# Patient Record
Sex: Male | Born: 1935 | Race: Black or African American | Hispanic: No | Marital: Married | Smoking: Never smoker
Health system: Southern US, Community
[De-identification: ages and names within clinical notes are randomized; demographics above are authoritative.]

## PROBLEM LIST (undated history)

## (undated) DIAGNOSIS — I10 Essential (primary) hypertension: Secondary | ICD-10-CM

## (undated) DIAGNOSIS — E7800 Pure hypercholesterolemia, unspecified: Secondary | ICD-10-CM

---

## 2005-03-14 ENCOUNTER — Inpatient Hospital Stay (HOSPITAL_COMMUNITY): Admission: AD | Admit: 2005-03-14 | Discharge: 2005-03-21 | Payer: Self-pay | Admitting: Interventional Cardiology

## 2005-03-17 ENCOUNTER — Ambulatory Visit: Payer: Self-pay | Admitting: Internal Medicine

## 2005-03-17 ENCOUNTER — Encounter (INDEPENDENT_AMBULATORY_CARE_PROVIDER_SITE_OTHER): Payer: Self-pay | Admitting: Interventional Cardiology

## 2005-04-15 ENCOUNTER — Ambulatory Visit: Payer: Self-pay | Admitting: Internal Medicine

## 2005-04-21 ENCOUNTER — Ambulatory Visit (HOSPITAL_COMMUNITY): Admission: RE | Admit: 2005-04-21 | Discharge: 2005-04-22 | Payer: Self-pay | Admitting: Internal Medicine

## 2005-04-21 ENCOUNTER — Ambulatory Visit: Payer: Self-pay | Admitting: Internal Medicine

## 2005-06-05 ENCOUNTER — Ambulatory Visit: Payer: Self-pay

## 2008-03-08 ENCOUNTER — Inpatient Hospital Stay (HOSPITAL_COMMUNITY): Admission: RE | Admit: 2008-03-08 | Discharge: 2008-03-14 | Payer: Self-pay | Admitting: Orthopedic Surgery

## 2008-03-10 ENCOUNTER — Ambulatory Visit: Payer: Self-pay | Admitting: Vascular Surgery

## 2008-03-10 ENCOUNTER — Encounter (INDEPENDENT_AMBULATORY_CARE_PROVIDER_SITE_OTHER): Payer: Self-pay | Admitting: Orthopedic Surgery

## 2008-04-10 ENCOUNTER — Encounter: Admission: RE | Admit: 2008-04-10 | Discharge: 2008-05-31 | Payer: Self-pay | Admitting: Orthopedic Surgery

## 2008-06-08 ENCOUNTER — Encounter: Admission: RE | Admit: 2008-06-08 | Discharge: 2008-06-08 | Payer: Self-pay | Admitting: Orthopedic Surgery

## 2009-08-15 IMAGING — CR DG CHEST 2V
2 series · 2 of 2 positions shown · non-contrast
Comparison: 03/14/2005

CLINICAL DATA: Preoperative respiratory exam for hip surgery

CHEST - 2 VIEW

[view not recorded (1 of 2)]
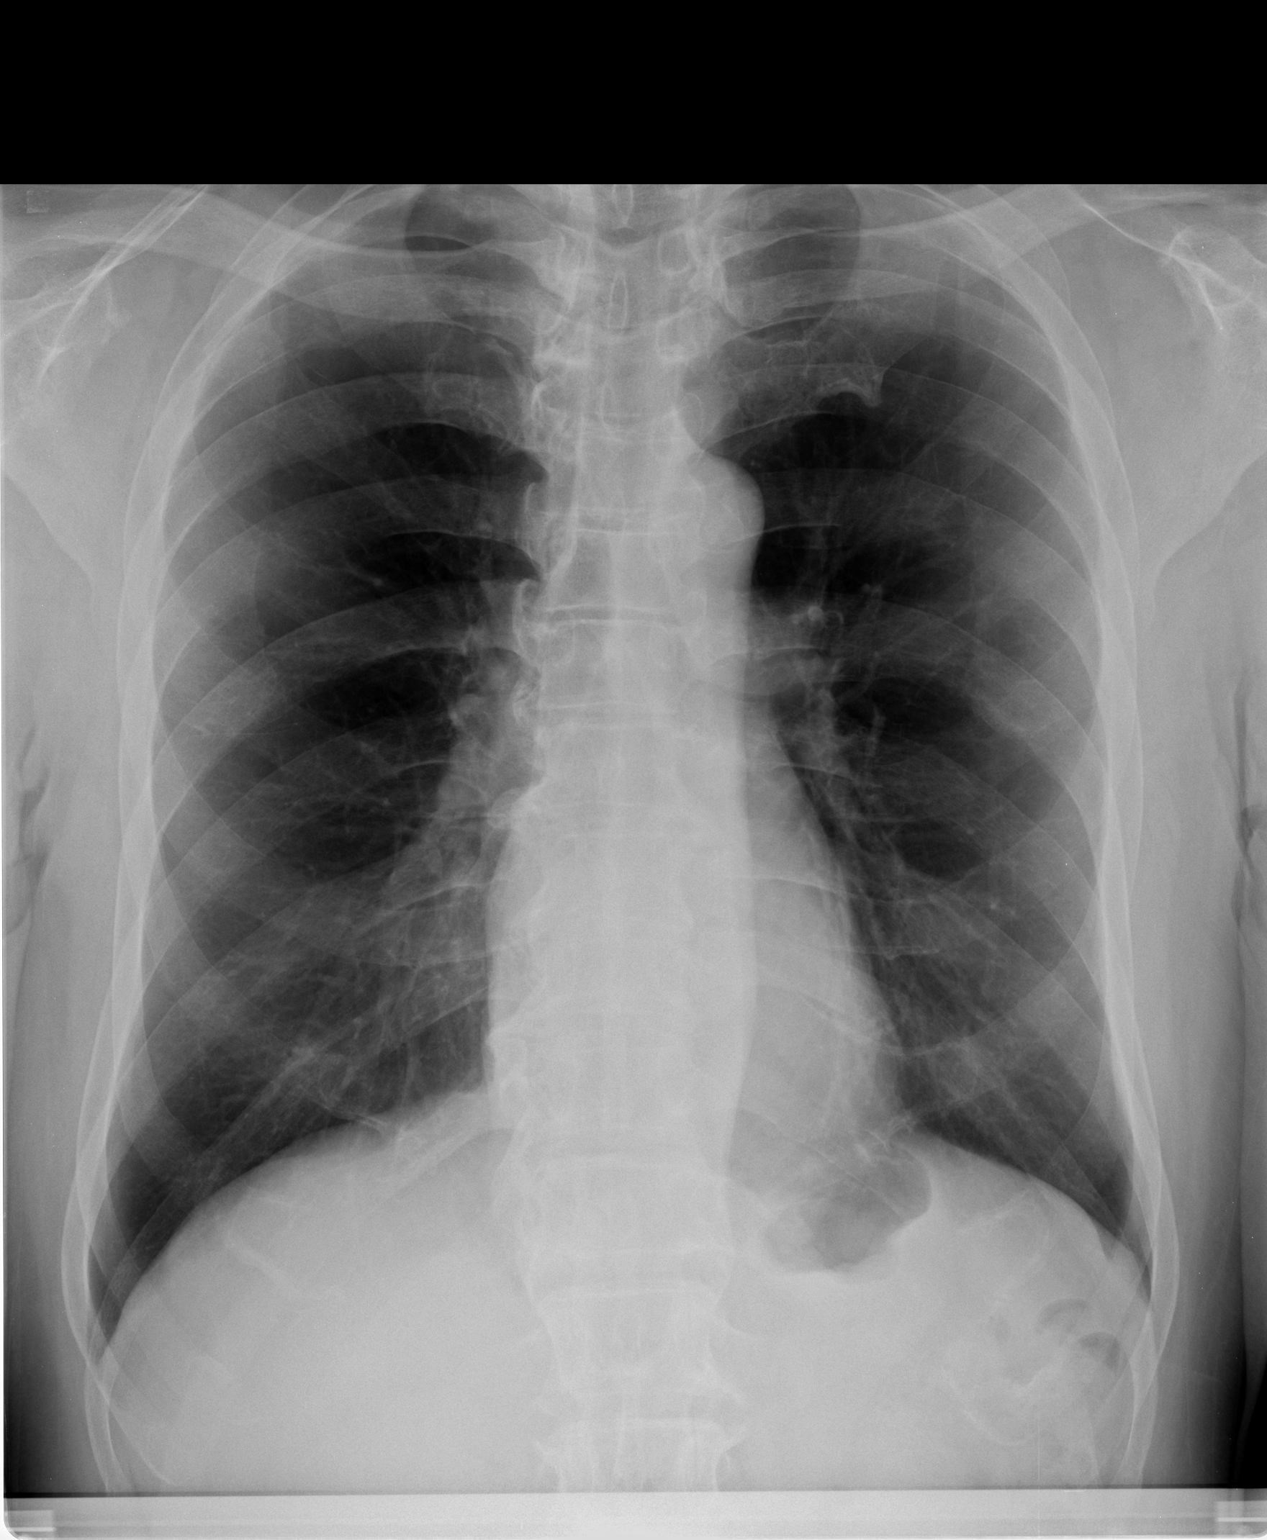

[view not recorded (2 of 2)]
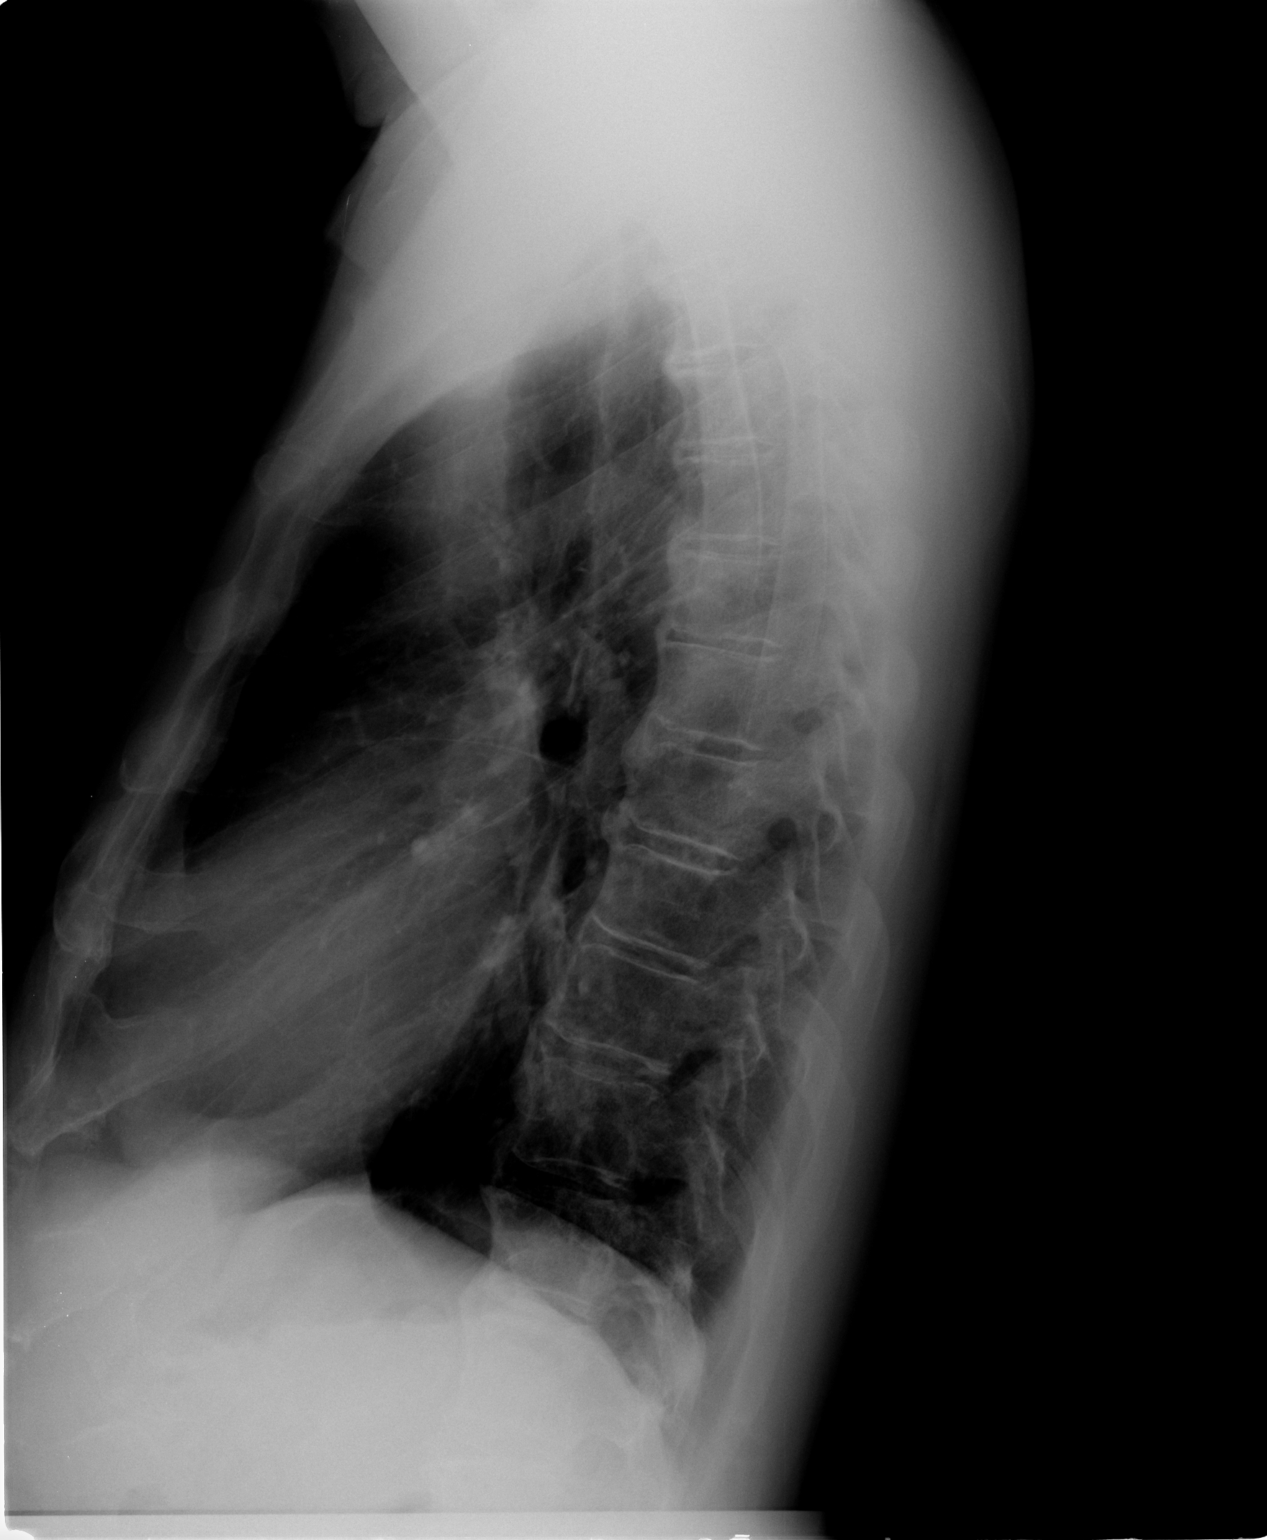

[2 of 2 positions shown; findings below may reference images not displayed]

FINDINGS: Heart size is normal.  Mediastinal contours are normal.
The lungs are clear.  No effusions.  Ordinary degenerative changes
effect the spine.
IMPRESSION: No active disease

## 2009-08-15 IMAGING — CR DG PORTABLE PELVIS
1 series · 1 of 1 positions shown · non-contrast
Comparison: None

CLINICAL DATA: Left total hip arthroplasty.

PORTABLE PELVIS

[view not recorded]
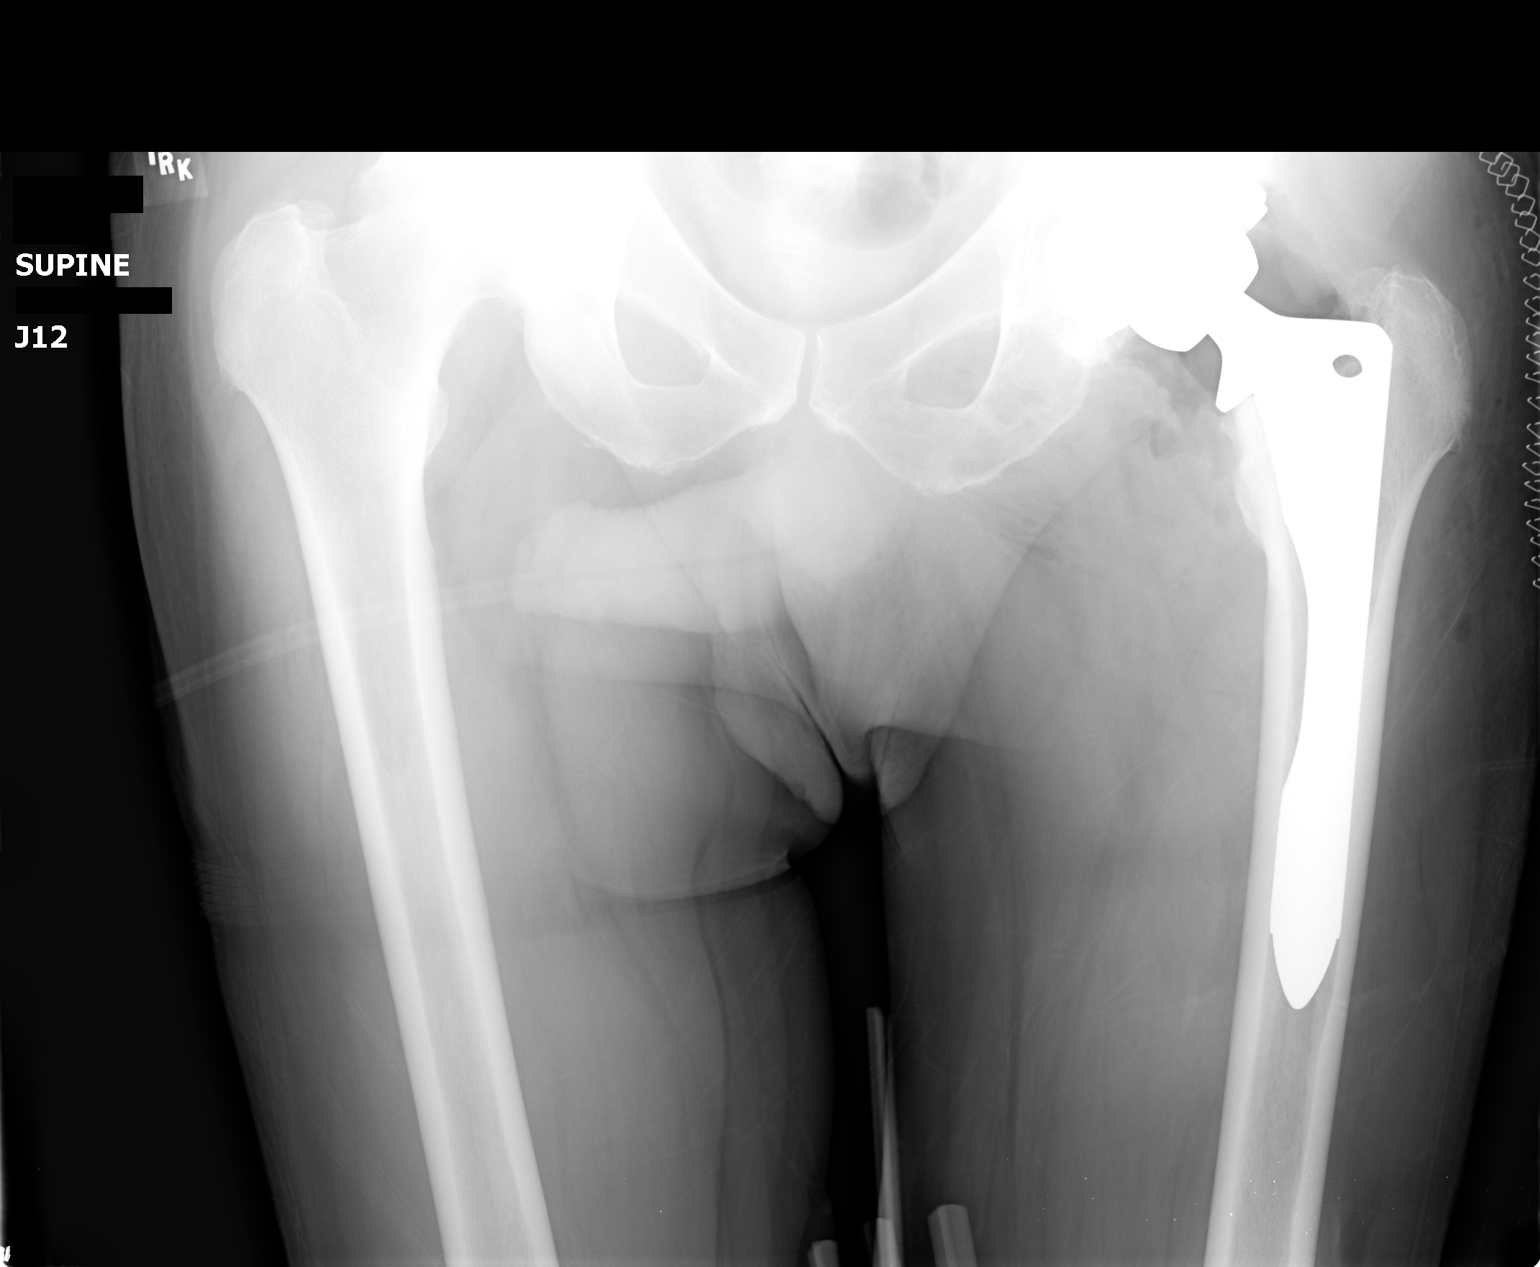

[1 of 1 positions shown; findings below may reference images not displayed]

FINDINGS: The patient is status post left total hip arthroplasty.
Joint and subcutaneous air noted.  Surgical skin staples are in
place.  Degenerative changes are seen in the right hip.
IMPRESSION: 1.  Left total hip arthroplasty without immediate complicating
feature.
2.  Right hip osteoarthritis.

## 2009-08-15 IMAGING — CR DG HIP 1V PORT*L*
1 series · 1 of 1 positions shown · non-contrast
Comparison: None

CLINICAL DATA: Left total hip arthroplasty.

PORTABLE LEFT HIP - 1 VIEW

[view not recorded]
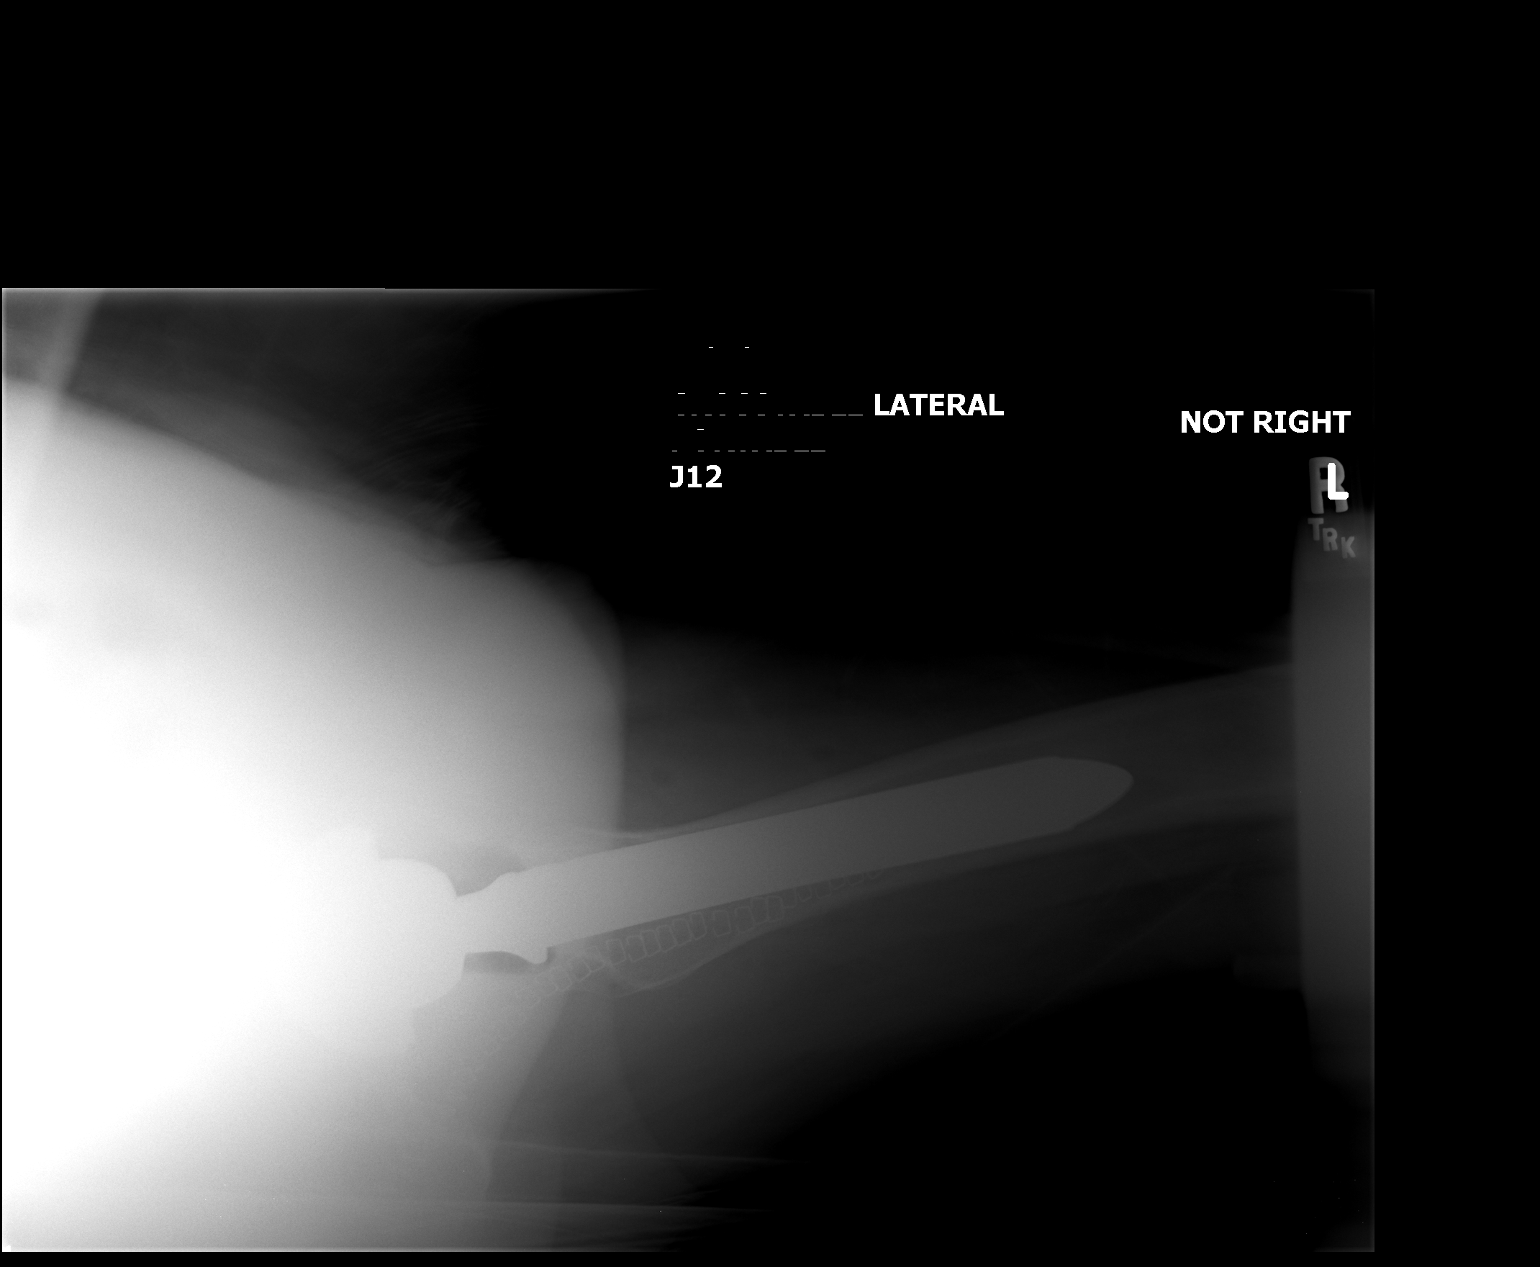

[1 of 1 positions shown; findings below may reference images not displayed]

FINDINGS: The patient is status post left total hip arthroplasty.
Joint and subcutaneous air are noted.  Surgical skin staples are in
place.
IMPRESSION: Left total hip arthroplasty without immediate complicating feature.

## 2009-08-20 IMAGING — CR DG CHEST 2V
2 series · 2 of 2 positions shown · non-contrast
Comparison: CT chest 03/09/2008 and chest x-ray 03/08/2008

CLINICAL DATA: Postop hip replacement with fever.

CHEST - 2 VIEW

[w chest pa]
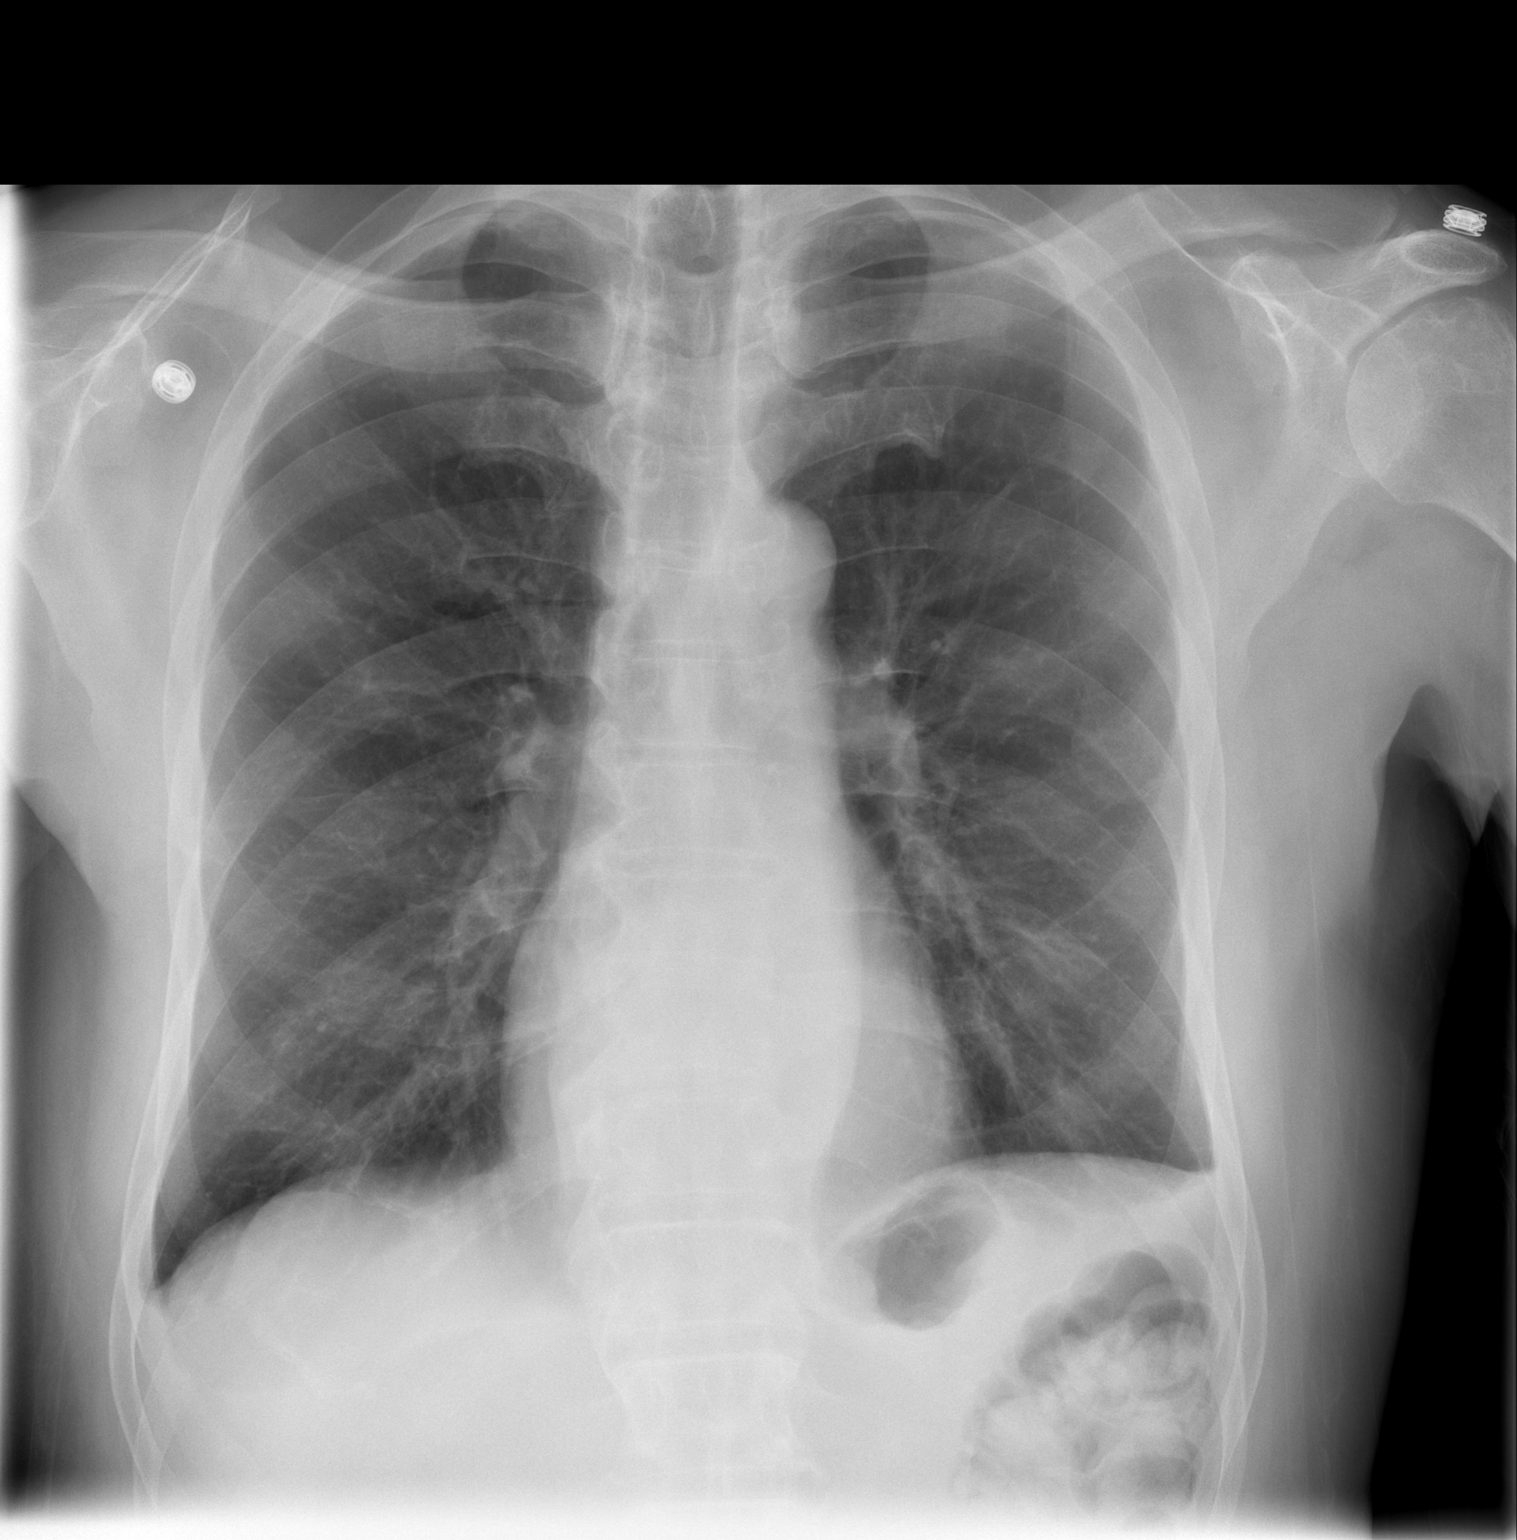

[w chest lat]
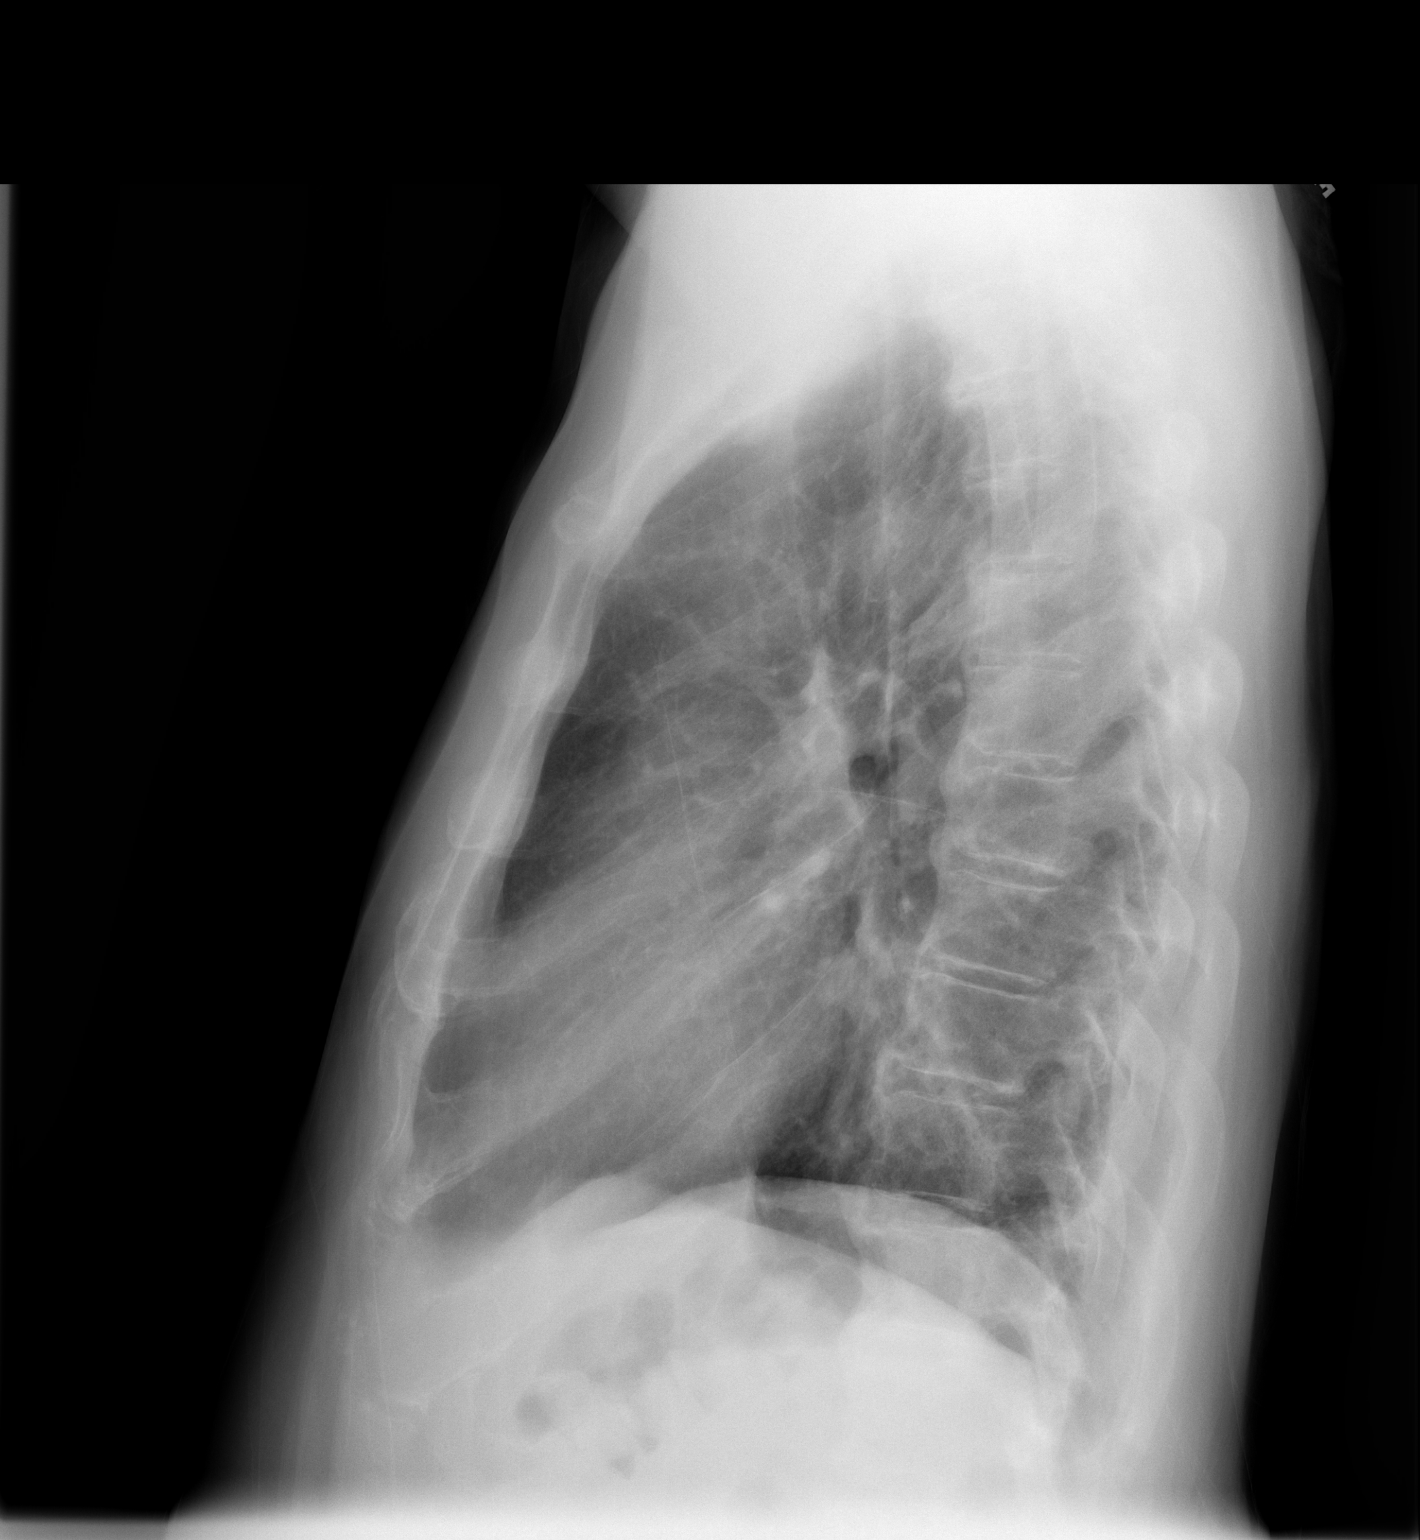

[2 of 2 positions shown; findings below may reference images not displayed]

FINDINGS: Trachea is midline.  Heart size normal.  Biapical pleural
thickening.  Lungs otherwise clear.  Trace bilateral pleural
effusions.
IMPRESSION: Trace bilateral pleural effusions.

## 2010-03-07 ENCOUNTER — Ambulatory Visit: Payer: Self-pay | Admitting: Radiology

## 2010-03-07 ENCOUNTER — Emergency Department (HOSPITAL_BASED_OUTPATIENT_CLINIC_OR_DEPARTMENT_OTHER): Admission: EM | Admit: 2010-03-07 | Discharge: 2010-03-07 | Payer: Self-pay | Admitting: Emergency Medicine

## 2010-10-15 NOTE — Consult Note (Signed)
NAME:  [name], [name] NO.:  [id number]   MEDICAL RECORD NO.:  [id number]          PATIENT TYPE:  INP   LOCATION:                               FACILITY:  [facility]   PHYSICIAN:  Armanda Magic, M.D.     DATE OF BIRTH:  Sep 08, 1932   DATE OF CONSULTATION:  03/09/2008  DATE OF DISCHARGE:                                 CONSULTATION   REFERRING PHYSICIAN:  Dyke Brackett, MD   PRIMARY PHYSICIAN:  Tally Joe, MD   CARDIOLOGIST:  Corky Crafts, MD.   CHIEF COMPLAINT:  Hypotension.   HISTORY OF PRESENT ILLNESS:  This is a 77 year old African American male  with a history of atrial flutter status post ablation who underwent left  total hip replacement yesterday.  Today, he was complaining of pain in  his left lower extremity behind the knee distally.  He then developed  acute hypotension with systolic blood pressures in the 70s, just after  completing rehab therapy.  After 1 liter of normal saline, his systolic  blood pressure was 120.  The patient is now feeling better but still  says he has some pain behind his popliteal fossa and the posterior calf  area.  A 2-D echocardiogram in 2006 showed an EF of 45-50% with mild MR  and moderate TR, was felt to be due to tachycardia-induced  cardiomyopathy.  He underwent Cardiolite which showed no ischemia in the  office, but his heart rate went to 200 and he was lightheaded and given  adenosine for atrial flutter, which then led to his atrial flutter  ablation in 2006.  Repeat Cardiolite preop in our office showed no  inducible ischemia and an EF that has normalized to 81%.  He was  subsequently clear for surgery.   Past medical history includes:  1. Atrial flutter.  2. Hypertension.  3. Tachycardia-induced cardiomyopathy, now with normal LV function.   PAST SURGICAL HISTORY:  Status post atrial flutter ablation.   Allergies are none.   Medications include cefazolin, Colace, Lovenox 40 mg subcu daily,  Dilaudid  p.r.n., and Robaxin p.r.n.   Medications prior to admission include:  1. Vasotec 20 mg b.i.d.  2. Atenolol 50 mg a day.  3. Lipitor 80 mg a day.  4. Hydrochlorothiazide 25 mg a day.  5. Norco p.r.n. for pain.   SOCIAL HISTORY:  He lives in [place].  He is married.  He denies any  tobacco use.  He rarely drinks alcohol.  He denies any drug use.  He is  retired from [employer], [employer].   FAMILY HISTORY:  There is no history of coronary disease.   REVIEW OF SYSTEMS:  Other than what is stated in HPI includes occasional  dizziness and lightheadedness today, as well as fatigue.   PHYSICAL EXAMINATION:  VITAL SIGNS:  Blood pressure is 99/53, O2  saturations 97% on 2 liters, respirations 20, pulse 74, and temperature  max 100.  GENERAL:  He is a well-developed, well-nourished male, lying in bed, in  no acute distress.  HEENT:  Benign.  NECK:  Supple without lymphadenopathy.  Carotid  upstrokes +2  bilaterally.  No bruits.  LUNGS:  Clear to auscultation throughout.  HEART:  Regular rate and rhythm.  No murmurs, rubs, or gallops.  Normal  S1 and S2.  ABDOMEN:  Benign.  EXTREMITIES:  There is no edema.  He does have sequential compression  devices on bilaterally.   Chest x-ray shows no active disease.   LABORATORY:  Sodium 133, potassium 4.5, chloride 99, bicarb 27, BUN 20,  creatinine 1.49, and glucose 124.  White cell count 10.4, platelet count  227, hemoglobin 9.7, and hematocrit 29.2.  D-dimer 1.65.  An EKG shows  sinus rhythm with nonspecific T-wave abnormality.   ASSESSMENT:  1. Acute symptomatic hypotension transiently.  Now, systolic blood      pressure is above 100 after fluid resuscitation.  He is currently      receiving blood for anemia.  BNP is elevated, and the patient has      been complaining of left lower extremity pain behind his popliteal      fossa extending down his posterior aspect of his left lower      extremity.  Suspect possible deep venous thrombosis and  pulmonary      embolism.  We will order a chest CT to rule out pulmonary embolism.      We will cycle cardiac enzymes.  Once blood pressure is stable off      IV fluids, at some point we need to restart beta blocker and low-      dose ACE inhibitor.  2. Status post left total hip replacement.  3. Atrial flutter status post ablation with no arrhythmias.   PLAN:  1. Chest CT to rule out PE.  2. Restart beta blocker and low-dose ACE inhibitor once the blood      pressure is stable off IV fluids.  We will follow with you.      Armanda Magic, M.D.  Electronically Signed     TT/MEDQ  D:  03/09/2008  T:  03/10/2008  Job:  [id number]

## 2010-10-15 NOTE — Op Note (Signed)
NAME:  [name], [name] NO.:  [id number]   MEDICAL RECORD NO.:  [id number]          PATIENT TYPE:  INP   LOCATION:  4739                         FACILITY:  [facility]   PHYSICIAN:  Dyke Brackett, M.D.    DATE OF BIRTH:  [birth year]   DATE OF PROCEDURE:  03/08/2008  DATE OF DISCHARGE:                               OPERATIVE REPORT   INDICATIONS:  This is a 77 year old with severe osteoarthritis of the  left hip thought to be amenable with left total hip replacement.   PREOPERATIVE DIAGNOSIS:  Osteoarthritis of the left hip.   POSTOPERATIVE DIAGNOSIS:  Osteoarthritis of the left hip.   OPERATION:  Left total hip replacement (DePuy Prodigy porous-coated stem  18-mm small stature, 1.5-mm neck length, 36-mm hip ball with 56-mm  pinnacle acetabulum).   SURGEON:  Dyke Brackett, MD   ASSISTANTKatrinka Blazing, PA   BLOOD LOSS:  Approximately 200.   DESCRIPTION OF PROCEDURE:  Sterile prep and drape, lateral position  posterior approach to the hip made splitting of the iliotibial band,  gluteus maximus, identification of the sciatic nerves, splitting the  short external rotators and a T-capsulotomy made in the hip.  Significant osteophytes were encountered in the posterior and anterior  aspect of the hip which were removed followed by cutting the head  conservatively.  A very short neck was present approximately 1  fingerbreadth above the lesser trochanter.  The femur was then reamed to  17.5 mm to accept an 18-mm stem and then progressively broached with a  small stature.  Given the patient's age in the 110s, a good press fit of  the broach was obtained.   Attention was next directed to the acetabulum.  A guide was placed  anterior and posteriorly.  Aggressive reaming up to a 55-mm acetabular  reamer to accept a 56-mm cup with a 36-mm hip ball.  Trial was placed  followed by the final components and then a trial cup liner used with  the broach to size the appropriate neck length  as dictated above.  The  final components were inserted in the acetabular cup followed by femoral  stem with good press fit of the femoral stem, good stability, and again  with final components in place, there was no tendency for dislocation at  all and the leg lengths appeared to be equal.  The wounds were irrigated  and closure was effected with #1 Ethibond, 2-0 Vicryl and skin clips,  Marcaine with epinephrine on the skin.  Lightly compressive sterile  dressing was applied.  Taken to recovery room in stable condition.     Dyke Brackett, M.D.  Electronically Signed    WDC/MEDQ  D:  03/08/2008  T:  03/08/2008  Job:  [id number]

## 2010-10-18 NOTE — H&P (Signed)
[name], [name]              ACCOUNT NO.:  [id number]   MEDICAL RECORD NO.:  [id number]          PATIENT TYPE:  INP   LOCATION:  4740                         FACILITY:  [facility]   PHYSICIAN:  [name], MDDATE OF BIRTH:  1932/12/04   DATE OF ADMISSION:  03/14/2005  DATE OF DISCHARGE:                                HISTORY & PHYSICAL   PRIMARY CARE PHYSICIAN:  [name], M.D.   CHIEF COMPLAINT:  Tachycardia.   HISTORY OF PRESENT ILLNESS:  The patient is a 77 year old man who had not  seen a doctor in many years.  He went to his primary care doctor who noticed  an abnormal EKG, hypertension, and tachycardia.  He was referred for a  stress test.  While on the treadmill, he became extremely tachycardic to the  260s and lightheaded.  A 6 mg adenosine IV bolus brought his heart rate back  down to baseline.  His rhythm was a slow atrial flutter at approximately a  rate of 130.  He remains asymptomatic.   PAST MEDICAL HISTORY:  Hypertension.   PAST SURGICAL HISTORY:  None.   CURRENT MEDICATIONS:  Tenoretic 50/25.   ALLERGIES:  No known drug allergies.   SOCIAL HISTORY:  The patient occasionally drinks alcohol.  No tobacco.  No  IV drug abuse.  No herbal medicines.   FAMILY HISTORY:  No early CAD.   REVIEW OF SYSTEMS:  No fever or chills.  No weight loss.  He does have left  hip pain.  All other systems negative.   PHYSICAL EXAMINATION:  VITAL SIGNS:  Blood pressure 150/80, heart rate of  135, respiratory rate is 16.  GENERAL:  The patient is awake and alert, no apparent distress.  NECK:  No JVD.  No carotid bruits.  CARDIOVASCULAR:  Irregularly irregular rhythm, tachycardic, S1 S2.  LUNGS:  Clear to auscultation bilaterally.  ABDOMEN:  Soft, nontender, nondistended.  EXTREMITIES:  Showed no cyanosis, clubbing, or edema.  Dorsalis pedis pulses  2+ bilaterally.   Labs at the time of dictation are pending.   ASSESSMENT/PLAN:  A 77 year old with atrial  flutter, question whether he has  an accessory pathway.   PLAN:  1.  Increase rate control meds, will not give a calcium-channel blocker at      this time since it does seem that he is able to conduct 1:1 with his      flutter. It seems likely that he may have an accessory pathway which      allows him to conduct 1:1.  We will use beta-blockade at this time.  We      will also add enalapril 5 mg b.i.d. for hypertension. We will evaluate      his LV function with an echo.  If decreased LV function, then we may      need to take cath to rule out coronary artery disease.  2.  Observe at [facility] at least overnight.  3.  Heparin drip for now.  Will eventually need Coumadin for      anticoagulation.  4.  We will rule  out MI with enzymes.           ______________________________  Joshua Crafts, MD     JSV/MEDQ  D:  03/14/2005  T:  03/14/2005  Job:  [id number]

## 2010-10-18 NOTE — Discharge Summary (Signed)
NAME:  [name]              ACCOUNT NO.:  [id number]   MEDICAL RECORD NO.:  [id number]          PATIENT TYPE:  INP   LOCATION:  5002                         FACILITY:  [facility]   PHYSICIAN:  Dyke Brackett, M.D.    DATE OF BIRTH:  [birth year]   DATE OF ADMISSION:  03/08/2008  DATE OF DISCHARGE:  03/14/2008                               DISCHARGE SUMMARY   PROCEDURE:  Left total hip replacement.   ADMITTING DIAGNOSIS:  Left hip osteoarthritis.   DISCHARGE DIAGNOSIS:  Status post left total hip replacement for left  hip osteoarthritis.   HOSPITAL COURSE:  [name] is a 77 year old male following a left  total hip replacement transferred to PACU in stable condition and up to  5000.  Postoperative day #1 was March 09, 2008, the patient was doing  well with minimal pain.  No history of chest pain or trouble breathing.  Vital signs stable, afebrile.  Hemoglobin of 9.7.  Sat up with PT and  OT, weightbearing as tolerated, weaning PCA pump.  Postoperative day #2  was March 10, 2008, the patient still doing well, hemoglobin 9.2.  Decided to get a lower extremity Doppler ultrasound which was negative  for DVT.  Initially, the patient was in a monitored bed and planning to  transfer to 5000, but we need cardiology clearance as the patient had an  episode of hypotension and acute blood loss anemia. Cardiology  following, no evidence of DVT or PE.  Blood pressure was stable, so I  did agree to transfer to 5000 to help monitor, and postoperative day #3  was on March 11, 2008, followed by Cardiology.  The patient was in  stable condition per Cardiology.  They were okay with discharging home.  On postoperative day #3, his hemoglobin is 8.9, vitals signs stable,  afebrile.  The patient still weightbearing as tolerated left lower  extremity.  Going on to postoperative day #4 is March 12, 2008,  hemoglobin at 8.4, vital signs stable, afebrile.  Weightbearing as  tolerated.  Given 40 mEq  of potassium for hypokalemia.  The patient's  hemoglobin was discussed with Dr. Patty Sermons, had 2 units of blood  transfusion due to his history of A-flutter.  Postoperative day #5,  hemoglobin 10.6, slightly elevated temperature at 101.5.  Chest x-ray  showed bilateral pleural effusion.  Urinalysis was negative.  Plan to  consider discharge home next day if temperature decreases.  Going on to  postoperative day #6 on March 13, 2008, Cardiology okay to be  discharged home.  The patient is planned to discharge home, 10.5 was the  hemoglobin, on March 14, 2008, postoperative day #6.   ASSESSMENT AND PLAN:  [name] is status post left total hip  replacement.  Discharged home on March 14, 2008 and on postoperative  #6 doing well.  Weightbearing as tolerated.  Knee immobilizer on the  left lower extremity.  Regular diet.  Going to follow up with Dr.  Candise Bowens office in about 2 weeks for staple removal.   DISCHARGE MEDICATIONS:  1. Lovenox     40 mg subcu per  day for total of 14 days      postoperatively.  2. Robaxin 500 mg q.6-8 h. p.r.n. pain.  3. Percocet 5/325 one to two tablets p.o. q.4-6 h. p.r.n. pain.  4. Hydrochlorothiazide 25 mg daily.  5. Enalapril 20 mg daily.  6. Atenolol 50 mg daily.  7. Lipitor 40 mg daily.  8. Aspirin 81 mg daily.  9. Vitamin B12 daily.      Sharol Given, PA      Dyke Brackett, M.D.  Electronically Signed    JBS/MEDQ  D:  06/28/2008  T:  06/29/2008  Job:  [id number]

## 2011-03-03 LAB — BASIC METABOLIC PANEL
BUN: 20
BUN: 6
BUN: 9
CO2: 27
CO2: 30
CO2: 30
Calcium: 8.4
Calcium: 8.9
Calcium: 9.2
Chloride: 102
Chloride: 103
Chloride: 104
Chloride: 99
Creatinine, Ser: 1.08
Creatinine, Ser: 1.49
GFR calc Af Amer: 56 — ABNORMAL LOW
GFR calc non Af Amer: 46 — ABNORMAL LOW
GFR calc non Af Amer: 55 — ABNORMAL LOW
GFR calc non Af Amer: >60
Glucose, Bld: 101 — ABNORMAL HIGH
Glucose, Bld: 103 — ABNORMAL HIGH
Glucose, Bld: 122 — ABNORMAL HIGH
Glucose, Bld: 124 — ABNORMAL HIGH
Glucose, Bld: 95
Potassium: 3.2 — ABNORMAL LOW
Potassium: 3.8
Potassium: 4.1
Potassium: 4.5
Sodium: 133 — ABNORMAL LOW
Sodium: 133 — ABNORMAL LOW
Sodium: 139
Sodium: 141

## 2011-03-03 LAB — CBC
HCT: 24.9 — ABNORMAL LOW
HCT: 26.3 — ABNORMAL LOW
HCT: 29.2 — ABNORMAL LOW
HCT: 37.1 — ABNORMAL LOW
Hemoglobin: 10.6 — ABNORMAL LOW
Hemoglobin: 12.5 — ABNORMAL LOW
Hemoglobin: 8.9 — ABNORMAL LOW
Hemoglobin: 9.2 — ABNORMAL LOW
Hemoglobin: 9.7 — ABNORMAL LOW
MCHC: 33.3
MCHC: 33.7
MCHC: 34.2
MCHC: 34.2
MCV: 100.3 — ABNORMAL HIGH
MCV: 101.2 — ABNORMAL HIGH
MCV: 93.4
MCV: 94.4
MCV: 95.7
Platelets: 152
Platelets: 181
Platelets: 227
Platelets: 301
RBC: 2.88 — ABNORMAL LOW
RBC: 3.7 — ABNORMAL LOW
RDW: 12.3
RDW: 12.6
RDW: 14.2
RDW: 14.8
RDW: 14.9
RDW: 15
WBC: 10.4
WBC: 5.2
WBC: 9.9

## 2011-03-03 LAB — PROTIME-INR
INR: 1
Prothrombin Time: 13.8

## 2011-03-03 LAB — DIFFERENTIAL
Basophils Absolute: 0
Basophils Relative: 1
Eosinophils Absolute: 0
Eosinophils Relative: 0
Lymphocytes Relative: 21
Lymphs Abs: 1.1
Monocytes Absolute: 0.4
Monocytes Relative: 7
Neutro Abs: 3.7
Neutrophils Relative %: 71

## 2011-03-03 LAB — URINALYSIS, ROUTINE W REFLEX MICROSCOPIC
Bilirubin Urine: NEGATIVE
Bilirubin Urine: NEGATIVE
Glucose, UA: NEGATIVE
Hgb urine dipstick: NEGATIVE
Hgb urine dipstick: NEGATIVE
Ketones, ur: NEGATIVE
Ketones, ur: NEGATIVE
Nitrite: NEGATIVE
Protein, ur: NEGATIVE
Protein, ur: NEGATIVE
Specific Gravity, Urine: 1.008
Urobilinogen, UA: 0.2
Urobilinogen, UA: 2 — ABNORMAL HIGH
pH: 6.5

## 2011-03-03 LAB — URINE CULTURE
Colony Count: NO GROWTH
Culture: NO GROWTH

## 2011-03-03 LAB — COMPREHENSIVE METABOLIC PANEL
ALT: 26
AST: 31
Albumin: 4.1
Alkaline Phosphatase: 72
BUN: 13
CO2: 32
Calcium: 9.9
Chloride: 105
Creatinine, Ser: 1.18
GFR calc Af Amer: >60
GFR calc non Af Amer: >60
Glucose, Bld: 92
Potassium: 3.6
Sodium: 144
Total Bilirubin: 1.2
Total Protein: 7.7

## 2011-03-03 LAB — TYPE AND SCREEN
ABO/RH(D): O POS
Antibody Screen: NEGATIVE

## 2011-03-03 LAB — CARDIAC PANEL(CRET KIN+CKTOT+MB+TROPI)
CK, MB: 1
CK, MB: 1.3
CK, MB: 1.4
Relative Index: 0.3
Relative Index: 0.3
Total CK: 344 — ABNORMAL HIGH
Troponin I: 0.02
Troponin I: <0.01

## 2011-03-03 LAB — PREPARE RBC (CROSSMATCH)

## 2011-03-03 LAB — CROSSMATCH

## 2011-03-03 LAB — HEMOGLOBIN AND HEMATOCRIT, BLOOD
HCT: 23.8 — ABNORMAL LOW
Hemoglobin: 8 — ABNORMAL LOW

## 2011-03-03 LAB — ABO/RH: ABO/RH(D): O POS

## 2011-03-03 LAB — D-DIMER, QUANTITATIVE: D-Dimer, Quant: 1.65 — ABNORMAL HIGH

## 2011-03-03 LAB — APTT: aPTT: 31

## 2011-03-03 LAB — B-NATRIURETIC PEPTIDE (CONVERTED LAB): Pro B Natriuretic peptide (BNP): 91

## 2012-01-12 ENCOUNTER — Emergency Department (HOSPITAL_COMMUNITY)
Admission: EM | Admit: 2012-01-12 | Discharge: 2012-01-12 | Disposition: A | Discharge status: Home / Self Care | Payer: Medicare Other | Attending: Internal Medicine | Admitting: Internal Medicine

## 2012-01-12 ENCOUNTER — Encounter (HOSPITAL_COMMUNITY): Payer: Self-pay | Admitting: Neurology

## 2012-01-12 DIAGNOSIS — E7800 Pure hypercholesterolemia, unspecified: Secondary | ICD-10-CM | POA: Insufficient documentation

## 2012-01-12 DIAGNOSIS — F10929 Alcohol use, unspecified with intoxication, unspecified: Secondary | ICD-10-CM

## 2012-01-12 DIAGNOSIS — F1010 Alcohol abuse, uncomplicated: Secondary | ICD-10-CM | POA: Insufficient documentation

## 2012-01-12 DIAGNOSIS — I10 Essential (primary) hypertension: Secondary | ICD-10-CM | POA: Insufficient documentation

## 2012-01-12 HISTORY — DX: Pure hypercholesterolemia, unspecified: E78.00

## 2012-01-12 HISTORY — DX: Essential (primary) hypertension: I10

## 2012-01-12 LAB — BASIC METABOLIC PANEL
Calcium: 9.5 mg/dL (ref 8.4–10.5)
GFR calc Af Amer: 52 mL/min — ABNORMAL LOW (ref >90)
GFR calc non Af Amer: 45 mL/min — ABNORMAL LOW (ref >90)
Potassium: 5 mEq/L (ref 3.5–5.1)
Sodium: 141 mEq/L (ref 135–145)

## 2012-01-12 LAB — CBC
Hemoglobin: 12.3 g/dL — ABNORMAL LOW (ref 13.0–17.0)
MCH: 32.9 pg (ref 26.0–34.0)
MCHC: 33.3 g/dL (ref 30.0–36.0)
Platelets: 207 10*3/uL (ref 150–400)
RBC: 3.74 MIL/uL — ABNORMAL LOW (ref 4.22–5.81)

## 2012-01-12 LAB — ETHANOL: Alcohol, Ethyl (B): 177 mg/dL — ABNORMAL HIGH (ref 0–11)

## 2012-01-12 NOTE — ED Notes (Signed)
Per ems- Pt was sitting at table at 1115, wife stepped away to get lunch, she returned and found pt unconscious with vomit on shirt. When EMS arrived pt was awake, not following commands, diaphoretic. Speech was noted to be slurred. BP was 88/50. Given 1 L of fluid speech improved. EKG unremarkable. No acute neuro deficits. Following commands at current, still lethargic. Pt reports thinking he may have taken too much atenolol this morning. HR 52, CBG 122. Denying any pain.

## 2012-01-12 NOTE — ED Notes (Signed)
Speech is clear, following commands. Equal grip strengths. Moving all extremities. Pt able to remember right before syncopal episode. Does not remember the episode. Woke up and everyone was around him. Denying any pain. Thoughts are coherent, concise.

## 2012-01-12 NOTE — ED Provider Notes (Signed)
History     CSN: [id number]  Arrival date & time 01/12/12  1328   None     Chief Complaint  Patient presents with  . Altered Mental Status    (Consider location/radiation/quality/duration/timing/severity/associated sxs/prior treatment) HPI  This is a 78 year old man presents with a history of passing out for about 1-1/2 hours this morning. He was found in the house by his wife, seated in the chair, drooling and unresponsive with his breakfast in front of him on the table. He recovered his consciousness en route to the ED by ambulance. At the time when I interview him, he was fully alert and did not have any complaints. He reports that he had just taken his usual blood pressure meds (Atenolol) before he passed out but he had taken two tablets instead of the usual one tablet of his atenolol. BP by EMS was 88/50, HR 52, and CBG was 122 and EKG was normal. He did not take any other medicines - usually takes medications for cholesterol as well. He admits to taking one glass of ram and 1 bottle of beer last evening.  However, he reports a history of dizziness in especially with standing up and this has been going on for the last several months. The patient denies a history of a fever or shortness of breath. No history of chest pain. His wife is very concerned because of his weight loss with at least 20 pounds in the last one year. He also reports history of cold intolerance and inhibition from physical activities for the last several years.     Past Medical History  Diagnosis Date  . Hypertension   . Hypercholesteremia     History reviewed. No pertinent past surgical history.  No family history on file.  History  Substance Use Topics  . Smoking status: Never Smoker   . Smokeless tobacco: Not on file  . Alcohol Use: No      Review of Systems  Constitutional: Positive for activity change, appetite change, fatigue and unexpected weight change. Negative for fever, chills and  diaphoresis.  HENT: Negative.   Eyes: Negative for visual disturbance.  Respiratory: Negative for cough, choking, chest tightness, shortness of breath, wheezing and stridor.   Cardiovascular: Negative for chest pain, palpitations and leg swelling.  Gastrointestinal: Positive for vomiting. Negative for nausea, abdominal pain, diarrhea, constipation, blood in stool, abdominal distention, anal bleeding and rectal pain.  Genitourinary: Negative.   Musculoskeletal: Negative for myalgias, back pain, joint swelling, arthralgias and gait problem.  Neurological: Negative for tremors, seizures, speech difficulty, weakness and numbness.  Psychiatric/Behavioral: Negative.     Allergies  Review of patient's allergies indicates not on file.  Home Medications  No current outpatient prescriptions on file.  BP 116/57  Pulse 73  Temp 97.4 F (36.3 C) (Oral)  Resp 16  SpO2 98%  Physical Exam  Constitutional: He is oriented to person, place, and time. He appears well-developed and well-nourished. No distress.  HENT:  Head: Normocephalic and atraumatic.  Eyes: Conjunctivae and EOM are normal. Pupils are equal, round, and reactive to light.  Neck: Normal range of motion. Neck supple.  Cardiovascular: Normal rate and regular rhythm.   Pulmonary/Chest: Effort normal. No respiratory distress. He has no wheezes. He has rales in the left lower field. He exhibits no tenderness.  Abdominal: Soft. Bowel sounds are normal.  Musculoskeletal: He exhibits no edema and no tenderness.  Neurological: He is alert and oriented to person, place, and time. He has normal strength  and normal reflexes. A cranial nerve deficit is present. No sensory deficit. He displays a negative Romberg sign. Coordination normal. GCS eye subscore is 4. GCS verbal subscore is 5. GCS motor subscore is 6.  Skin: Skin is warm and dry.    ED Course  Procedures (including critical care time)  Labs Reviewed - No data to display No results  found.  EKG - Normal Sinus rhythm Normal Axis  Normal PR, QT and QRS intervals Normal Axis  No LVH  No ST segment of T wave changes..   MDM  It is not clear whether this was a syncopal episode though it is unlikely given the history. He ingested a higher dose of Atenolol which was twice his usual dose. The blood pressure was also reduced as indicated by the EMS records with a BP of 88/50 and a pulse rate of 52. This might explain his presentation especially given his full recovery of mental status after 1L of IV fluids. The other possibility I cannot exclude is alcohol ingestion in this patient who admits to taking a beer and rum last evening. Cardiac or neurological causes are unlikely. This patient will need a thorough evaluation for his symptoms of weight loss and cold intolerance by his PCP. For now we will do the alcohol blood level, BMET, CBC. We will observe him for now.        Dow Adolph, MD 01/12/12 [id number]  Dow Adolph, MD 01/12/12 1529  Dow Adolph, MD 01/12/12 [id number]  Dow Adolph, MD 01/12/12 1538  Dow Adolph, MD 01/12/12 [phone]

## 2012-01-12 NOTE — ED Provider Notes (Signed)
I have personally seen and examined the patient.  I have discussed the plan of care with the resident.  I have reviewed the documentation on PMH/FH/Soc. History.  I have reviewed the documentation of the resident and agree.  Doug Sou, MD 01/12/12 [phone]

## 2012-01-12 NOTE — ED Notes (Signed)
Pt walked to bathroom without assistance. Pt stable

## 2012-01-12 NOTE — ED Provider Notes (Addendum)
Patient reportedly unarousable this morning for a period of approximately half an hour patient thinks he may take an extra dose of atenolol this morning. He is presently asymptomatic on exam Glasgow Coma Score 15 alert awake heart regular rate and rhythm lungs clear auscultation abdomen nondistended nontender patient not lightheaded on standing Lab results noted. difficulty in arousing patient consistent with acute alcohol intoxication. Results for orders placed during the hospital encounter of 01/12/12  BASIC METABOLIC PANEL      Component Value Range   Sodium 141  135 - 145 mEq/L   Potassium 5.0  3.5 - 5.1 mEq/L   Chloride 103  96 - 112 mEq/L   CO2 27  19 - 32 mEq/L   Glucose, Bld 115 (*) 70 - 99 mg/dL   BUN 17  6 - 23 mg/dL   Creatinine, Ser 1.61 (*) 0.50 - 1.35 mg/dL   Calcium 9.5  8.4 - 09.6 mg/dL   GFR calc non Af Amer 45 (*) >90 mL/min   GFR calc Af Amer 52 (*) >90 mL/min  CBC      Component Value Range   WBC 8.3  4.0 - 10.5 K/uL   RBC 3.74 (*) 4.22 - 5.81 MIL/uL   Hemoglobin 12.3 (*) 13.0 - 17.0 g/dL   HCT 04.5 (*) 40.9 - 81.1 %   MCV 98.7  78.0 - 100.0 fL   MCH 32.9  26.0 - 34.0 pg   MCHC 33.3  30.0 - 36.0 g/dL   RDW 91.4  78.2 - 95.6 %   Platelets 207  150 - 400 K/uL  ETHANOL      Component Value Range   Alcohol, Ethyl (B) 177 (*) 0 - 11 mg/dL   No results found.  Doug Sou, MD 01/12/12 1551  Doug Sou, MD 01/12/12 [phone]

## 2012-01-12 NOTE — ED Notes (Signed)
Pt states he understands all discharge instructions

## 2015-12-14 DIAGNOSIS — E782 Mixed hyperlipidemia: Secondary | ICD-10-CM | POA: Diagnosis not present

## 2015-12-14 DIAGNOSIS — I129 Hypertensive chronic kidney disease with stage 1 through stage 4 chronic kidney disease, or unspecified chronic kidney disease: Secondary | ICD-10-CM | POA: Diagnosis not present

## 2015-12-14 DIAGNOSIS — N183 Chronic kidney disease, stage 3 (moderate): Secondary | ICD-10-CM | POA: Diagnosis not present

## 2015-12-14 DIAGNOSIS — R7309 Other abnormal glucose: Secondary | ICD-10-CM | POA: Diagnosis not present

## 2016-02-08 DIAGNOSIS — Z23 Encounter for immunization: Secondary | ICD-10-CM | POA: Diagnosis not present

## 2016-06-20 DIAGNOSIS — I129 Hypertensive chronic kidney disease with stage 1 through stage 4 chronic kidney disease, or unspecified chronic kidney disease: Secondary | ICD-10-CM | POA: Diagnosis not present

## 2016-06-20 DIAGNOSIS — R7301 Impaired fasting glucose: Secondary | ICD-10-CM | POA: Diagnosis not present

## 2016-06-20 DIAGNOSIS — I10 Essential (primary) hypertension: Secondary | ICD-10-CM | POA: Diagnosis not present

## 2016-06-20 DIAGNOSIS — E782 Mixed hyperlipidemia: Secondary | ICD-10-CM | POA: Diagnosis not present

## 2016-06-20 DIAGNOSIS — R7303 Prediabetes: Secondary | ICD-10-CM | POA: Diagnosis not present

## 2016-06-20 DIAGNOSIS — N183 Chronic kidney disease, stage 3 (moderate): Secondary | ICD-10-CM | POA: Diagnosis not present

## 2016-12-18 DIAGNOSIS — I10 Essential (primary) hypertension: Secondary | ICD-10-CM | POA: Diagnosis not present

## 2016-12-18 DIAGNOSIS — E782 Mixed hyperlipidemia: Secondary | ICD-10-CM | POA: Diagnosis not present

## 2016-12-18 DIAGNOSIS — R7303 Prediabetes: Secondary | ICD-10-CM | POA: Diagnosis not present

## 2016-12-18 DIAGNOSIS — N183 Chronic kidney disease, stage 3 (moderate): Secondary | ICD-10-CM | POA: Diagnosis not present

## 2017-05-13 DIAGNOSIS — K4090 Unilateral inguinal hernia, without obstruction or gangrene, not specified as recurrent: Secondary | ICD-10-CM | POA: Diagnosis not present

## 2017-06-01 DIAGNOSIS — I10 Essential (primary) hypertension: Secondary | ICD-10-CM | POA: Diagnosis not present

## 2017-06-01 DIAGNOSIS — T783XXA Angioneurotic edema, initial encounter: Secondary | ICD-10-CM | POA: Diagnosis not present

## 2017-06-25 DIAGNOSIS — N183 Chronic kidney disease, stage 3 (moderate): Secondary | ICD-10-CM | POA: Diagnosis not present

## 2017-06-25 DIAGNOSIS — E782 Mixed hyperlipidemia: Secondary | ICD-10-CM | POA: Diagnosis not present

## 2017-06-25 DIAGNOSIS — R7303 Prediabetes: Secondary | ICD-10-CM | POA: Diagnosis not present

## 2017-06-25 DIAGNOSIS — I10 Essential (primary) hypertension: Secondary | ICD-10-CM | POA: Diagnosis not present

## 2017-12-31 DIAGNOSIS — Z0000 Encounter for general adult medical examination without abnormal findings: Secondary | ICD-10-CM | POA: Diagnosis not present

## 2017-12-31 DIAGNOSIS — N183 Chronic kidney disease, stage 3 (moderate): Secondary | ICD-10-CM | POA: Diagnosis not present

## 2017-12-31 DIAGNOSIS — I10 Essential (primary) hypertension: Secondary | ICD-10-CM | POA: Diagnosis not present

## 2017-12-31 DIAGNOSIS — R7303 Prediabetes: Secondary | ICD-10-CM | POA: Diagnosis not present

## 2018-01-29 DIAGNOSIS — Z23 Encounter for immunization: Secondary | ICD-10-CM | POA: Diagnosis not present

## 2018-07-09 DIAGNOSIS — D649 Anemia, unspecified: Secondary | ICD-10-CM | POA: Diagnosis not present

## 2018-07-09 DIAGNOSIS — I10 Essential (primary) hypertension: Secondary | ICD-10-CM | POA: Diagnosis not present

## 2018-07-09 DIAGNOSIS — N183 Chronic kidney disease, stage 3 (moderate): Secondary | ICD-10-CM | POA: Diagnosis not present

## 2018-07-09 DIAGNOSIS — R7303 Prediabetes: Secondary | ICD-10-CM | POA: Diagnosis not present

## 2019-01-14 DIAGNOSIS — I129 Hypertensive chronic kidney disease with stage 1 through stage 4 chronic kidney disease, or unspecified chronic kidney disease: Secondary | ICD-10-CM | POA: Diagnosis not present

## 2019-01-14 DIAGNOSIS — E782 Mixed hyperlipidemia: Secondary | ICD-10-CM | POA: Diagnosis not present

## 2019-01-14 DIAGNOSIS — D649 Anemia, unspecified: Secondary | ICD-10-CM | POA: Diagnosis not present

## 2019-01-14 DIAGNOSIS — R7303 Prediabetes: Secondary | ICD-10-CM | POA: Diagnosis not present

## 2019-01-14 DIAGNOSIS — I10 Essential (primary) hypertension: Secondary | ICD-10-CM | POA: Diagnosis not present

## 2019-01-14 DIAGNOSIS — Z0000 Encounter for general adult medical examination without abnormal findings: Secondary | ICD-10-CM | POA: Diagnosis not present

## 2019-06-23 DIAGNOSIS — Z86010 Personal history of colonic polyps: Secondary | ICD-10-CM | POA: Diagnosis not present

## 2019-06-23 DIAGNOSIS — K5909 Other constipation: Secondary | ICD-10-CM | POA: Diagnosis not present

## 2019-06-23 DIAGNOSIS — R6881 Early satiety: Secondary | ICD-10-CM | POA: Diagnosis not present

## 2019-06-28 ENCOUNTER — Other Ambulatory Visit: Payer: Self-pay | Admitting: Geriatric Medicine

## 2019-06-28 DIAGNOSIS — R6881 Early satiety: Secondary | ICD-10-CM

## 2019-07-07 ENCOUNTER — Ambulatory Visit
Admission: RE | Admit: 2019-07-07 | Discharge: 2019-07-07 | Disposition: A | Discharge status: Home / Self Care | Payer: Medicare Other | Source: Ambulatory Visit | Attending: Geriatric Medicine | Admitting: Geriatric Medicine

## 2019-07-07 DIAGNOSIS — K224 Dyskinesia of esophagus: Secondary | ICD-10-CM | POA: Diagnosis not present

## 2019-07-07 DIAGNOSIS — R6881 Early satiety: Secondary | ICD-10-CM

## 2019-07-19 DIAGNOSIS — I10 Essential (primary) hypertension: Secondary | ICD-10-CM | POA: Diagnosis not present

## 2019-07-19 DIAGNOSIS — N183 Chronic kidney disease, stage 3 unspecified: Secondary | ICD-10-CM | POA: Diagnosis not present

## 2019-07-19 DIAGNOSIS — R7303 Prediabetes: Secondary | ICD-10-CM | POA: Diagnosis not present

## 2019-07-19 DIAGNOSIS — E782 Mixed hyperlipidemia: Secondary | ICD-10-CM | POA: Diagnosis not present

## 2019-08-04 DIAGNOSIS — R6881 Early satiety: Secondary | ICD-10-CM | POA: Diagnosis not present

## 2019-08-04 DIAGNOSIS — Z01818 Encounter for other preprocedural examination: Secondary | ICD-10-CM | POA: Diagnosis not present

## 2019-08-04 DIAGNOSIS — Z79899 Other long term (current) drug therapy: Secondary | ICD-10-CM | POA: Diagnosis not present

## 2019-08-04 DIAGNOSIS — R1310 Dysphagia, unspecified: Secondary | ICD-10-CM | POA: Diagnosis not present

## 2019-08-16 DIAGNOSIS — R1310 Dysphagia, unspecified: Secondary | ICD-10-CM | POA: Diagnosis not present

## 2019-08-16 DIAGNOSIS — Z1211 Encounter for screening for malignant neoplasm of colon: Secondary | ICD-10-CM | POA: Diagnosis not present

## 2019-08-16 DIAGNOSIS — Z01818 Encounter for other preprocedural examination: Secondary | ICD-10-CM | POA: Diagnosis not present

## 2019-08-24 DIAGNOSIS — K900 Celiac disease: Secondary | ICD-10-CM | POA: Diagnosis not present

## 2019-08-24 DIAGNOSIS — A048 Other specified bacterial intestinal infections: Secondary | ICD-10-CM | POA: Diagnosis not present

## 2019-08-24 DIAGNOSIS — K2970 Gastritis, unspecified, without bleeding: Secondary | ICD-10-CM | POA: Diagnosis not present

## 2019-08-25 DIAGNOSIS — K200 Eosinophilic esophagitis: Secondary | ICD-10-CM | POA: Diagnosis not present

## 2019-09-05 DIAGNOSIS — R630 Anorexia: Secondary | ICD-10-CM | POA: Diagnosis not present

## 2019-11-05 DIAGNOSIS — R630 Anorexia: Secondary | ICD-10-CM | POA: Diagnosis not present

## 2019-11-05 DIAGNOSIS — R634 Abnormal weight loss: Secondary | ICD-10-CM | POA: Diagnosis not present

## 2019-11-16 DIAGNOSIS — R1084 Generalized abdominal pain: Secondary | ICD-10-CM | POA: Diagnosis not present

## 2019-12-20 DIAGNOSIS — R634 Abnormal weight loss: Secondary | ICD-10-CM | POA: Diagnosis not present

## 2019-12-20 DIAGNOSIS — R630 Anorexia: Secondary | ICD-10-CM | POA: Diagnosis not present

## 2020-01-16 DIAGNOSIS — D649 Anemia, unspecified: Secondary | ICD-10-CM | POA: Diagnosis not present

## 2020-01-16 DIAGNOSIS — Z0000 Encounter for general adult medical examination without abnormal findings: Secondary | ICD-10-CM | POA: Diagnosis not present

## 2020-01-16 DIAGNOSIS — R7303 Prediabetes: Secondary | ICD-10-CM | POA: Diagnosis not present

## 2020-01-16 DIAGNOSIS — I10 Essential (primary) hypertension: Secondary | ICD-10-CM | POA: Diagnosis not present

## 2020-03-13 DIAGNOSIS — R630 Anorexia: Secondary | ICD-10-CM | POA: Diagnosis not present

## 2020-03-13 DIAGNOSIS — R634 Abnormal weight loss: Secondary | ICD-10-CM | POA: Diagnosis not present

## 2020-06-10 DIAGNOSIS — Z20822 Contact with and (suspected) exposure to covid-19: Secondary | ICD-10-CM | POA: Diagnosis not present

## 2020-07-18 DIAGNOSIS — I10 Essential (primary) hypertension: Secondary | ICD-10-CM | POA: Diagnosis not present

## 2020-07-18 DIAGNOSIS — E782 Mixed hyperlipidemia: Secondary | ICD-10-CM | POA: Diagnosis not present

## 2020-07-18 DIAGNOSIS — R7303 Prediabetes: Secondary | ICD-10-CM | POA: Diagnosis not present

## 2020-07-31 ENCOUNTER — Emergency Department (HOSPITAL_COMMUNITY)
Admission: EM | Admit: 2020-07-31 | Discharge: 2020-07-31 | Disposition: A | Discharge status: Home / Self Care | Payer: Medicare Other | Attending: Emergency Medicine | Admitting: Emergency Medicine

## 2020-07-31 ENCOUNTER — Other Ambulatory Visit: Payer: Self-pay

## 2020-07-31 ENCOUNTER — Encounter (HOSPITAL_COMMUNITY): Payer: Self-pay | Admitting: Emergency Medicine

## 2020-07-31 DIAGNOSIS — W010XXA Fall on same level from slipping, tripping and stumbling without subsequent striking against object, initial encounter: Secondary | ICD-10-CM | POA: Diagnosis not present

## 2020-07-31 DIAGNOSIS — Y92009 Unspecified place in unspecified non-institutional (private) residence as the place of occurrence of the external cause: Secondary | ICD-10-CM | POA: Insufficient documentation

## 2020-07-31 DIAGNOSIS — S0990XA Unspecified injury of head, initial encounter: Secondary | ICD-10-CM | POA: Diagnosis not present

## 2020-07-31 DIAGNOSIS — S0591XA Unspecified injury of right eye and orbit, initial encounter: Secondary | ICD-10-CM | POA: Diagnosis present

## 2020-07-31 DIAGNOSIS — R531 Weakness: Secondary | ICD-10-CM | POA: Diagnosis not present

## 2020-07-31 DIAGNOSIS — W19XXXA Unspecified fall, initial encounter: Secondary | ICD-10-CM | POA: Diagnosis not present

## 2020-07-31 DIAGNOSIS — Z5321 Procedure and treatment not carried out due to patient leaving prior to being seen by health care provider: Secondary | ICD-10-CM | POA: Insufficient documentation

## 2020-07-31 DIAGNOSIS — S0011XA Contusion of right eyelid and periocular area, initial encounter: Secondary | ICD-10-CM | POA: Insufficient documentation

## 2020-07-31 DIAGNOSIS — I959 Hypotension, unspecified: Secondary | ICD-10-CM | POA: Diagnosis not present

## 2020-07-31 LAB — CBC
HCT: 29.1 % — ABNORMAL LOW (ref 39.0–52.0)
Hemoglobin: 9.8 g/dL — ABNORMAL LOW (ref 13.0–17.0)
MCH: 35.4 pg — ABNORMAL HIGH (ref 26.0–34.0)
MCHC: 33.7 g/dL (ref 30.0–36.0)
MCV: 105.1 fL — ABNORMAL HIGH (ref 80.0–100.0)
Platelets: 220 10*3/uL (ref 150–400)
RBC: 2.77 MIL/uL — ABNORMAL LOW (ref 4.22–5.81)
RDW: 14.6 % (ref 11.5–15.5)
WBC: 4.2 10*3/uL (ref 4.0–10.5)
nRBC: 0 % (ref 0.0–0.2)

## 2020-07-31 LAB — URINALYSIS, ROUTINE W REFLEX MICROSCOPIC
Bilirubin Urine: NEGATIVE
Glucose, UA: NEGATIVE mg/dL
Hgb urine dipstick: NEGATIVE
Ketones, ur: NEGATIVE mg/dL
Leukocytes,Ua: NEGATIVE
Nitrite: NEGATIVE
Protein, ur: NEGATIVE mg/dL
Specific Gravity, Urine: 1.005 (ref 1.005–1.030)
pH: 6 (ref 5.0–8.0)

## 2020-07-31 LAB — BASIC METABOLIC PANEL
Anion gap: 9 (ref 5–15)
BUN: 23 mg/dL (ref 8–23)
CO2: 27 mmol/L (ref 22–32)
Calcium: 9.6 mg/dL (ref 8.9–10.3)
Chloride: 106 mmol/L (ref 98–111)
Creatinine, Ser: 1.51 mg/dL — ABNORMAL HIGH (ref 0.61–1.24)
GFR, Estimated: 45 mL/min — ABNORMAL LOW (ref >60)
Glucose, Bld: 87 mg/dL (ref 70–99)
Potassium: 3.9 mmol/L (ref 3.5–5.1)
Sodium: 142 mmol/L (ref 135–145)

## 2020-07-31 NOTE — ED Triage Notes (Signed)
Patient BIB [facility] from home. Patient had ground level fall at home. Bent over to pick up cell phone and fell, hematoma to right eye. Denies LOC. No blood thinners. Initial BP in 90s with EMS. 20 G LFA. 500cc bolus.

## 2020-07-31 NOTE — ED Notes (Signed)
LWBS 

## 2020-08-01 DIAGNOSIS — S0990XA Unspecified injury of head, initial encounter: Secondary | ICD-10-CM | POA: Diagnosis not present

## 2020-08-03 DIAGNOSIS — R519 Headache, unspecified: Secondary | ICD-10-CM | POA: Diagnosis not present

## 2020-08-03 DIAGNOSIS — R42 Dizziness and giddiness: Secondary | ICD-10-CM | POA: Diagnosis not present

## 2020-08-08 DIAGNOSIS — R634 Abnormal weight loss: Secondary | ICD-10-CM | POA: Diagnosis not present

## 2020-08-15 DIAGNOSIS — S0990XD Unspecified injury of head, subsequent encounter: Secondary | ICD-10-CM | POA: Diagnosis not present

## 2020-10-31 DIAGNOSIS — H5203 Hypermetropia, bilateral: Secondary | ICD-10-CM | POA: Diagnosis not present

## 2020-12-12 DIAGNOSIS — R634 Abnormal weight loss: Secondary | ICD-10-CM | POA: Diagnosis not present

## 2020-12-12 DIAGNOSIS — R630 Anorexia: Secondary | ICD-10-CM | POA: Diagnosis not present

## 2021-01-31 DEATH — deceased
